# Patient Record
Sex: Male | Born: 2005 | Hispanic: No | Marital: Single | Smoking: Never smoker
Health system: Southern US, Community
[De-identification: ages and names within clinical notes are randomized; demographics above are authoritative.]

---
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
## 2023-04-21 ENCOUNTER — Other Ambulatory Visit: Payer: Self-pay

## 2023-04-21 ENCOUNTER — Emergency Department: Payer: Self-pay

## 2023-04-21 DIAGNOSIS — R000 Tachycardia, unspecified: Secondary | ICD-10-CM | POA: Diagnosis present

## 2023-04-21 LAB — TROPONIN I (HIGH SENSITIVITY)
Troponin I (High Sensitivity): 2 ng/L (ref <18)
Troponin I (High Sensitivity): 4 ng/L (ref <18)

## 2023-04-21 LAB — BASIC METABOLIC PANEL
Anion gap: 12 (ref 5–15)
BUN: 10 mg/dL (ref 6–20)
CO2: 22 mmol/L (ref 22–32)
Calcium: 9.4 mg/dL (ref 8.9–10.3)
Chloride: 102 mmol/L (ref 98–111)
Creatinine, Ser: 0.7 mg/dL (ref 0.61–1.24)
GFR, Estimated: >60 mL/min (ref >60)
Glucose, Bld: 134 mg/dL — ABNORMAL HIGH (ref 70–99)
Potassium: 3.5 mmol/L (ref 3.5–5.1)
Sodium: 136 mmol/L (ref 135–145)

## 2023-04-21 LAB — CBC
HCT: 44.5 % (ref 39.0–52.0)
Hemoglobin: 15.3 g/dL (ref 13.0–17.0)
MCH: 30.1 pg (ref 26.0–34.0)
MCHC: 34.4 g/dL (ref 30.0–36.0)
MCV: 87.4 fL (ref 80.0–100.0)
Platelets: 181 10*3/uL (ref 150–400)
RBC: 5.09 MIL/uL (ref 4.22–5.81)
RDW: 11.7 % (ref 11.5–15.5)
WBC: 6.8 10*3/uL (ref 4.0–10.5)
nRBC: 0 % (ref 0.0–0.2)

## 2023-04-21 NOTE — ED Triage Notes (Addendum)
 Pt to ED via POV from home. Pt reports palpitations that started today. Pt denies cough or congestion. Pt denies SOB. Pt reports intermittent CP. Pt reports vapes

## 2023-04-22 ENCOUNTER — Emergency Department
Admission: EM | Admit: 2023-04-22 | Discharge: 2023-04-22 | Disposition: A | Discharge status: Home / Self Care | Payer: Self-pay | Attending: Emergency Medicine | Admitting: Emergency Medicine

## 2023-04-22 DIAGNOSIS — R000 Tachycardia, unspecified: Secondary | ICD-10-CM

## 2023-04-22 LAB — TSH: TSH: 0.831 u[IU]/mL (ref 0.350–4.500)

## 2023-04-22 LAB — MAGNESIUM: Magnesium: 2.2 mg/dL (ref 1.7–2.4)

## 2023-04-22 NOTE — ED Provider Notes (Signed)
 [facility] Provider Note    Event Date/Time   First MD Initiated Contact with Patient 04/22/23 [phone]     (approximate)   History   Tachycardia   HPI  Thomas Casey is a 18 y.o. male with no significant past medical history who presents to the emergency department with complaints of feeling like his heart was racing today.  No chest pain, shortness of breath, nausea, vomiting, diarrhea, fevers, cough.  Times have resolved.  States he is worried this could be anxiety.  No history of PE, DVT, exogenous estrogen use, recent fractures, surgery, trauma, hospitalization, prolonged travel or other immobilization. No lower extremity swelling or pain. No calf tenderness.  Denies any increased caffeine intake, illicit drug use other than marijuana, over-the-counter decongestants.   History provided by patient.    History reviewed. No pertinent past medical history.  Past Surgical History:  Procedure Laterality Date   LAPAROSCOPIC APPENDECTOMY N/A 08/21/2016   Procedure: APPENDECTOMY LAPAROSCOPIC;  Surgeon: Claudius Kaplan, MD;  Location: [facility] OR;  Service: General;  Laterality: N/A;    MEDICATIONS:  Prior to Admission medications   Not on File    Physical Exam   Triage Vital Signs: ED Triage Vitals  Encounter Vitals Group     BP 04/21/23 1658 123/75     Systolic BP Percentile --      Diastolic BP Percentile --      Pulse Rate 04/21/23 1658 (!) 140     Resp 04/21/23 1658 20     Temp 04/21/23 1658 98.5 F (36.9 C)     Temp Source 04/21/23 1658 Oral     SpO2 04/21/23 1658 98 %     Weight 04/21/23 2048 127 lb (57.6 kg)     Height 04/21/23 2048 5' 10 (1.778 m)     Head Circumference --      Peak Flow --      Pain Score 04/21/23 1658 1     Pain Loc --      Pain Education --      Exclude from Growth Chart --     Most recent vital signs: Vitals:   04/21/23 2048 04/22/23 0119  BP: 111/77 115/73  Pulse: (!) 54 60  Resp: 16 16  Temp: 97.9 F (36.6 C)  98.3 F (36.8 C)  SpO2: 100% 98%    CONSTITUTIONAL: Alert, responds appropriately to questions. Well-appearing; well-nourished HEAD: Normocephalic, atraumatic EYES: Conjunctivae clear, pupils appear equal, sclera nonicteric ENT: normal nose; moist mucous membranes NECK: Supple, normal ROM CARD: Regular and bradycardic; S1 and S2 appreciated RESP: Normal chest excursion without splinting or tachypnea; breath sounds clear and equal bilaterally; no wheezes, no rhonchi, no rales, no hypoxia or respiratory distress, speaking full sentences ABD/GI: Non-distended; soft, non-tender, no rebound, no guarding, no peritoneal signs BACK: The back appears normal EXT: Normal ROM in all joints; no deformity noted, no edema, no calf tenderness or calf swelling SKIN: Normal color for age and race; warm; no rash on exposed skin NEURO: Moves all extremities equally, normal speech PSYCH: The patient's mood and manner are appropriate.   ED Results / Procedures / Treatments   LABS: (all labs ordered are listed, but only abnormal results are displayed) Labs Reviewed  BASIC METABOLIC PANEL - Abnormal; Notable for the following components:      Result Value   Glucose, Bld 134 (*)    All other components within normal limits  CBC  MAGNESIUM  TSH  URINE DRUG SCREEN, QUALITATIVE (  [facility] ONLY)  TROPONIN I (HIGH SENSITIVITY)  TROPONIN I (HIGH SENSITIVITY)     EKG:  EKG Interpretation Date/Time:  Saturday April 21 2023 16:55:43 EST Ventricular Rate:  147 PR Interval:  122 QRS Duration:  78 QT Interval:  274 QTC Calculation: 428 R Axis:   89  Text Interpretation: Sinus tachycardia Biatrial enlargement Abnormal ECG No previous ECGs available Confirmed by Neomi Neptune [phone]) on 04/22/2023 12:36:17 AM         RADIOLOGY: My personal review and interpretation of imaging: Chest x-ray clear.  I have personally reviewed all radiology reports.   DG Chest 2 View Result Date: 04/21/2023 CLINICAL  DATA:  Chest pain.  Palpitations. EXAM: CHEST - 2 VIEW COMPARISON:  None Available. FINDINGS: The heart size and mediastinal contours are within normal limits. Both lungs are clear. The visualized skeletal structures are unremarkable. IMPRESSION: Negative two view chest x-ray Electronically Signed   By: Lonni Necessary M.D.   On: 04/21/2023 17:15     PROCEDURES:  Critical Care performed: No      Procedures    IMPRESSION / MDM / ASSESSMENT AND PLAN / ED COURSE  I reviewed the triage vital signs and the nursing notes.    Patient here with complaints of heart racing that has resolved.  No chest pain, shortness of breath.   DIFFERENTIAL DIAGNOSIS (includes but not limited to):   Panic attack, anemia, electrolyte derangement, thyroid  dysfunction, less likely ACS, PE or dissection   Patient's presentation is most consistent with acute presentation with potential threat to life or bodily function.   PLAN: EKG shows sinus tachycardia without ischemic change, interval abnormality or other arrhythmia.  Labs obtained from triage show normal hemoglobin, normal electrolytes.  Will add on magnesium, TSH.  Troponin x 2 negative.  No risk factors for PE and heart rate now in the 50s.  Suspect anxiety could be contributing.   MEDICATIONS GIVEN IN ED: Medications - No data to display   ED COURSE: Heart rate still in the 50s to 60s.  Patient asymptomatic.  Normal TSH and magnesium.  I feel he is safe for discharge.  Will refer to PCP for close follow-up if symptoms were to continue.  Discussed return precautions.   At this time, I do not feel there is any life-threatening condition present. I reviewed all nursing notes, vitals, pertinent previous records.  All lab and urine results, EKGs, imaging ordered have been independently reviewed and interpreted by myself.  I reviewed all available radiology reports from any imaging ordered this visit.  Based on my assessment, I feel the patient is  safe to be discharged home without further emergent workup and can continue workup as an outpatient as needed. Discussed all findings, treatment plan as well as usual and customary return precautions.  They verbalize understanding and are comfortable with this plan.  Outpatient follow-up has been provided as needed.  All questions have been answered.    CONSULTS:  none   OUTSIDE RECORDS REVIEWED: Reviewed last internal medicine visit on 03/04/2022.  Reviewed previous pediatric cardiology note on 05/18/2017.  Patient seen for vasovagal syncope.       FINAL CLINICAL IMPRESSION(S) / ED DIAGNOSES   Final diagnoses:  Sinus tachycardia     Rx / DC Orders   ED Discharge Orders          Ordered    Ambulatory Referral to Primary Care (Establish Care)        04/22/23 0109  Note:  This document was prepared using Dragon voice recognition software and may include unintentional dictation errors.   Angelyse Heslin, Josette SAILOR, DO 04/22/23 [phone]

## 2023-04-22 NOTE — Discharge Instructions (Addendum)
 Your blood counts, electrolyte, cardiac labs, thyroid  function test were normal today.  You have no risk factors for blood clots in your lungs.  I suspect your symptoms may be secondary to anxiety and we have placed a referral for a primary care doctor if the symptoms continue.
